# Patient Record
Sex: Female | Born: 2005 | Race: Black or African American | Hispanic: No | Marital: Single
Health system: Southern US, Community
[De-identification: ages and names within clinical notes are randomized; demographics above are authoritative.]

---
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
## 2023-03-05 IMAGING — DX DG CHEST 1V
1 series · 1 of 1 positions shown · non-contrast
Comparison: None.

CLINICAL DATA: Hemoptysis

EXAM:
CHEST  1 VIEW

[chest]
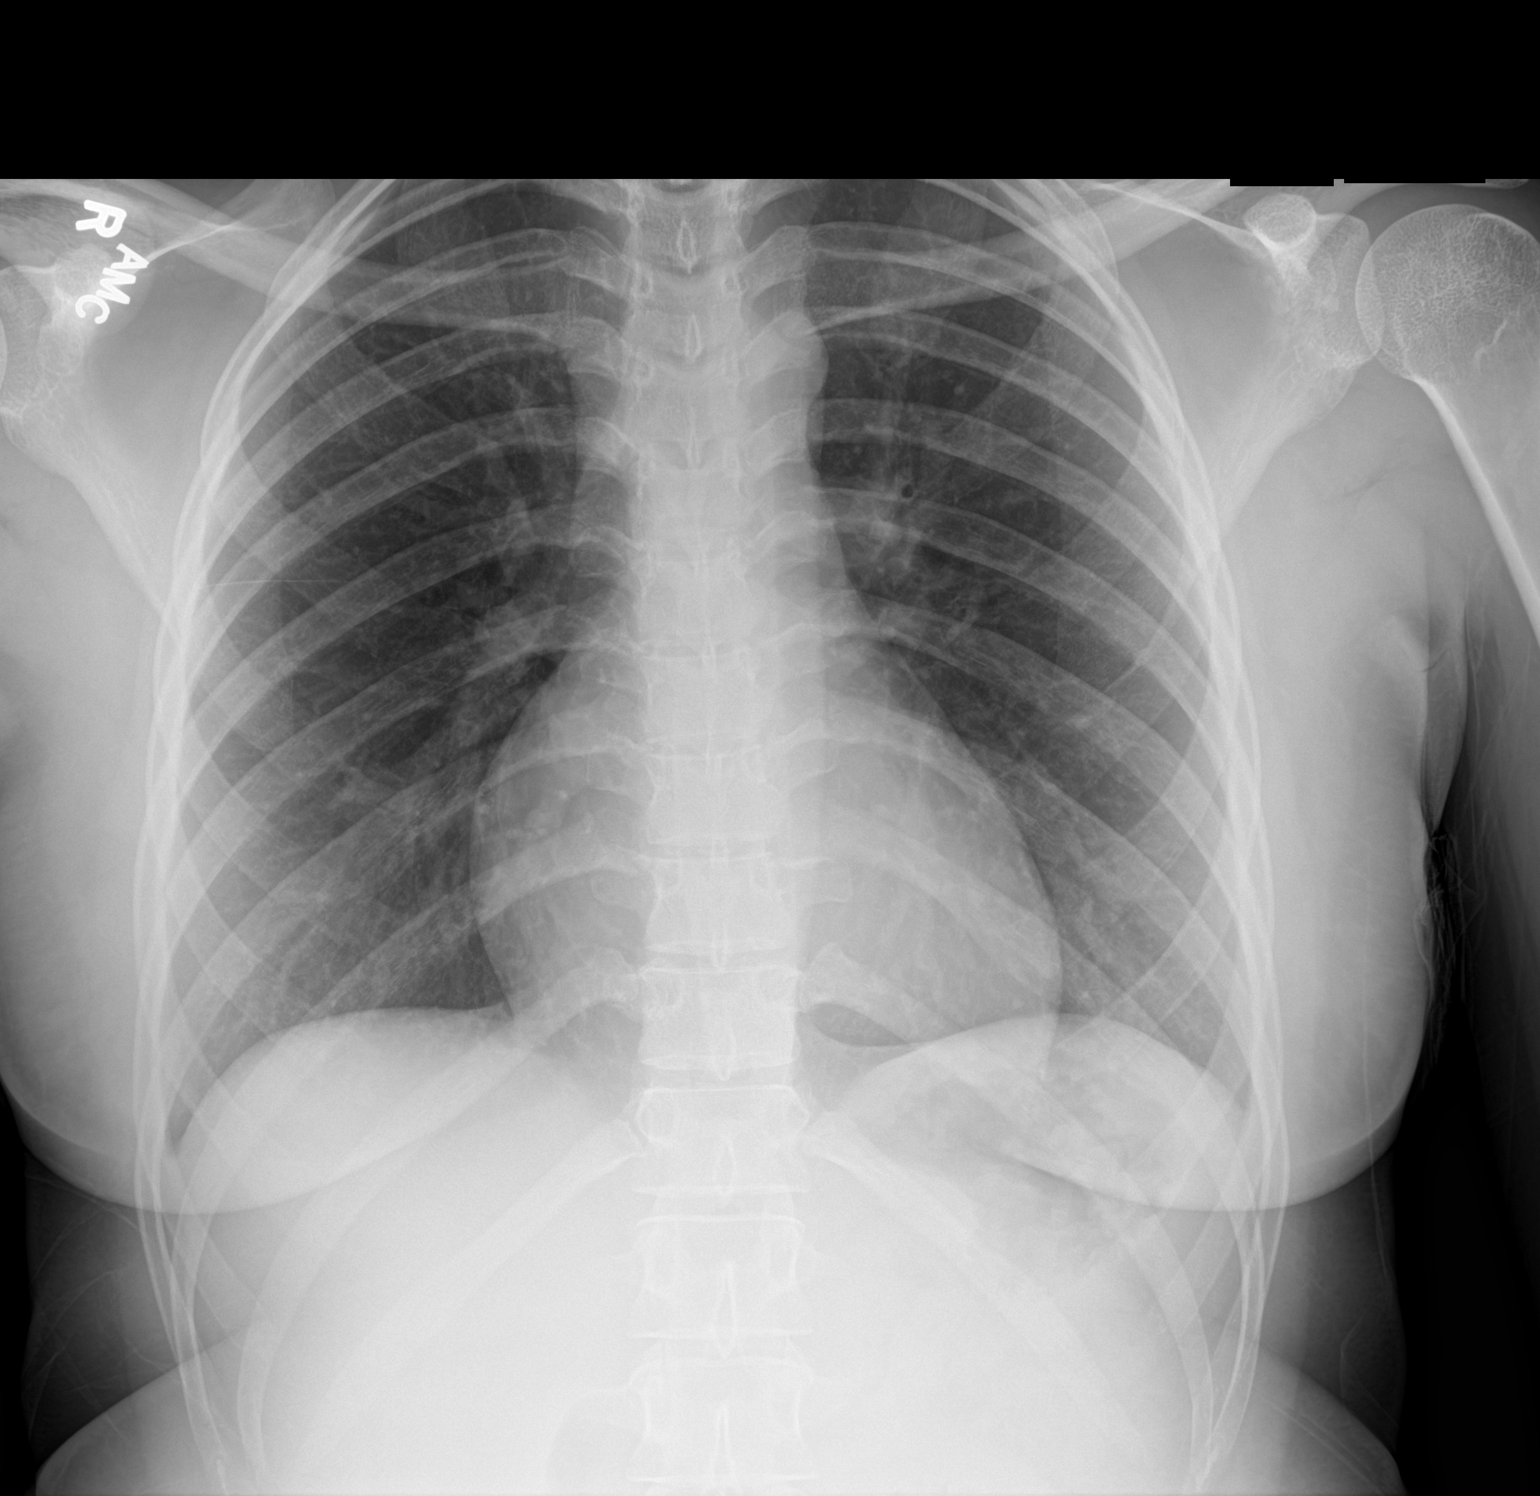

[1 of 1 positions shown; findings below may reference images not displayed]

FINDINGS: The heart size and mediastinal contours are within normal limits.
Both lungs are clear. The visualized skeletal structures are
unremarkable.
IMPRESSION: No active disease.

## 2023-09-04 DIAGNOSIS — R519 Headache, unspecified: Secondary | ICD-10-CM | POA: Insufficient documentation

## 2023-09-04 DIAGNOSIS — Z5321 Procedure and treatment not carried out due to patient leaving prior to being seen by health care provider: Secondary | ICD-10-CM | POA: Insufficient documentation

## 2023-09-04 DIAGNOSIS — Y92410 Unspecified street and highway as the place of occurrence of the external cause: Secondary | ICD-10-CM | POA: Diagnosis not present
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
## 2023-09-05 ENCOUNTER — Emergency Department (HOSPITAL_BASED_OUTPATIENT_CLINIC_OR_DEPARTMENT_OTHER)
Admission: EM | Admit: 2023-09-05 | Discharge: 2023-09-05 | Discharge status: Against Medical Advice | Payer: Medicaid Other | Attending: Emergency Medicine | Admitting: Emergency Medicine

## 2023-09-05 NOTE — ED Notes (Signed)
Reported to have left by registration

## 2024-05-14 ENCOUNTER — Other Ambulatory Visit: Payer: Self-pay | Admitting: Internal Medicine

## 2024-05-14 ENCOUNTER — Encounter: Payer: Self-pay | Admitting: Internal Medicine

## 2024-05-14 DIAGNOSIS — E010 Iodine-deficiency related diffuse (endemic) goiter: Secondary | ICD-10-CM

## 2024-05-15 ENCOUNTER — Ambulatory Visit
Admission: RE | Admit: 2024-05-15 | Discharge: 2024-05-15 | Disposition: A | Discharge status: Home / Self Care | Source: Ambulatory Visit | Attending: Internal Medicine | Admitting: Internal Medicine

## 2024-05-15 DIAGNOSIS — E010 Iodine-deficiency related diffuse (endemic) goiter: Secondary | ICD-10-CM

## 2024-11-01 ENCOUNTER — Other Ambulatory Visit: Payer: Self-pay

## 2024-11-01 ENCOUNTER — Encounter (HOSPITAL_COMMUNITY): Payer: Self-pay | Admitting: *Deleted

## 2024-11-01 ENCOUNTER — Emergency Department (HOSPITAL_COMMUNITY)
Admission: EM | Admit: 2024-11-01 | Discharge: 2024-11-01 | Disposition: A | Discharge status: Home / Self Care | Attending: Emergency Medicine | Admitting: Emergency Medicine

## 2024-11-01 ENCOUNTER — Emergency Department (HOSPITAL_COMMUNITY)

## 2024-11-01 DIAGNOSIS — J029 Acute pharyngitis, unspecified: Secondary | ICD-10-CM | POA: Insufficient documentation

## 2024-11-01 DIAGNOSIS — R079 Chest pain, unspecified: Secondary | ICD-10-CM

## 2024-11-01 DIAGNOSIS — R0602 Shortness of breath: Secondary | ICD-10-CM | POA: Insufficient documentation

## 2024-11-01 DIAGNOSIS — R0781 Pleurodynia: Secondary | ICD-10-CM | POA: Insufficient documentation

## 2024-11-01 DIAGNOSIS — R053 Chronic cough: Secondary | ICD-10-CM | POA: Insufficient documentation

## 2024-11-01 LAB — BASIC METABOLIC PANEL WITH GFR
Anion gap: 12 (ref 5–15)
BUN: 11 mg/dL (ref 6–20)
CO2: 25 mmol/L (ref 22–32)
Calcium: 9.7 mg/dL (ref 8.9–10.3)
Chloride: 101 mmol/L (ref 98–111)
Creatinine, Ser: 0.76 mg/dL (ref 0.44–1.00)
GFR, Estimated: >60 mL/min (ref >60)
Glucose, Bld: 91 mg/dL (ref 70–99)
Potassium: 4 mmol/L (ref 3.5–5.1)
Sodium: 138 mmol/L (ref 135–145)

## 2024-11-01 LAB — CBC
HCT: 40 % (ref 36.0–46.0)
Hemoglobin: 12.9 g/dL (ref 12.0–15.0)
MCH: 27 pg (ref 26.0–34.0)
MCHC: 32.3 g/dL (ref 30.0–36.0)
MCV: 83.7 fL (ref 80.0–100.0)
Platelets: 281 K/uL (ref 150–400)
RBC: 4.78 MIL/uL (ref 3.87–5.11)
RDW: 13.2 % (ref 11.5–15.5)
WBC: 4.8 K/uL (ref 4.0–10.5)
nRBC: 0 % (ref 0.0–0.2)

## 2024-11-01 LAB — RESP PANEL BY RT-PCR (RSV, FLU A&B, COVID)  RVPGX2
Influenza A by PCR: NEGATIVE
Influenza B by PCR: NEGATIVE
Resp Syncytial Virus by PCR: NEGATIVE
SARS Coronavirus 2 by RT PCR: NEGATIVE

## 2024-11-01 LAB — TROPONIN I (HIGH SENSITIVITY)
Troponin I (High Sensitivity): <2 ng/L (ref <18)
Troponin I (High Sensitivity): <2 ng/L (ref <18)

## 2024-11-01 LAB — HCG, SERUM, QUALITATIVE: Preg, Serum: NEGATIVE

## 2024-11-01 MED ORDER — IBUPROFEN 400 MG PO TABS
600.0000 mg | ORAL_TABLET | Freq: Once | ORAL | Status: AC
  Administered 2024-11-01: 600 mg via ORAL
  Filled 2024-11-01: qty 1

## 2024-11-01 NOTE — Discharge Instructions (Signed)
 Your test for heart attack (troponin) was negative but a second one was drawn and is currently in the lab.  You need to follow this up in your MyChart, if this is elevated/abnormal it is very important to come back to the hospital.  Otherwise, use ibuprofen  and/or Tylenol  to help with pain.  Follow-up with your primary care provider.  Return to the ER for any new or worsening symptoms.

## 2024-11-01 NOTE — ED Triage Notes (Signed)
 Pt states that when she woke up from her nap around 4pm she noted that she was having chest pain and sob and felt hot.  Pt went to work but continued to feel CP and sob.  Pt is speaking in full sentences, fortunately no acute distress.

## 2024-11-01 NOTE — ED Provider Notes (Signed)
 [facility] EMERGENCY DEPARTMENT AT [facility] Provider Note   CSN: [id number] Arrival date & time: 11/01/24  9563     Patient presents with: Chest Pain   Valerie Richardson is a 18 y.o. female.   HPI 18 year old female presents with chest pain.  She noticed that yesterday afternoon around 4 PM.  Sharp pain in the middle of her chest, did get worse with breathing but then that resolved.  She now has minimal but substantially improved chest pain throughout the night.  She denies any fevers or new cough.  Has developed a little mild sore throat this morning.  She has felt this way with pneumonia in the past and so she became concerned and want to get checked out.  She has some shortness of breath that has also resolved.  Denies any leg swelling, recent travel, recent surgery.  She is not on OCPs.  Prior to Admission medications  Not on File    Allergies: Patient has no known allergies.    Review of Systems  Constitutional:  Negative for fever.  Respiratory:  Positive for shortness of breath.   Cardiovascular:  Positive for chest pain. Negative for leg swelling.    Updated Vital Signs BP 108/76   Pulse 76   Temp 98 F (36.7 C) (Oral)   Resp 16   SpO2 98%   Physical Exam Vitals and nursing note reviewed.  Constitutional:      Appearance: She is well-developed.  HENT:     Head: Normocephalic and atraumatic.  Cardiovascular:     Rate and Rhythm: Normal rate and regular rhythm.     Heart sounds: Normal heart sounds.  Pulmonary:     Effort: Pulmonary effort is normal.     Breath sounds: Normal breath sounds.  Abdominal:     Palpations: Abdomen is soft.     Tenderness: There is no abdominal tenderness.  Musculoskeletal:     Right lower leg: No edema.     Left lower leg: No edema.  Skin:    General: Skin is warm and dry.  Neurological:     Mental Status: She is alert.     (all labs ordered are listed, but only abnormal results are displayed) Labs Reviewed   RESP PANEL BY RT-PCR (RSV, FLU A&B, COVID)  RVPGX2  BASIC METABOLIC PANEL WITH GFR  CBC  HCG, SERUM, QUALITATIVE  TROPONIN I (HIGH SENSITIVITY)  TROPONIN I (HIGH SENSITIVITY)    EKG: EKG Interpretation Date/Time:  Sunday November 01 2024 05:06:49 EST Ventricular Rate:  89 PR Interval:  138 QRS Duration:  94 QT Interval:  350 QTC Calculation: 425 R Axis:   91  Text Interpretation: Normal sinus rhythm Rightward axis  no acute ST/T changes Confirmed by Freddi Hamilton [phone]) on 11/01/2024 8:15:50 AM  Radiology: [facility] Chest 2 View Result Date: 11/01/2024 CLINICAL DATA:  Chest pain and shortness of breath. EXAM: CHEST - 2 VIEW COMPARISON:  December 26, 2021 FINDINGS: The heart size and mediastinal contours are within normal limits. Both lungs are clear. The visualized skeletal structures are unremarkable. IMPRESSION: No active cardiopulmonary disease. Electronically Signed   By: Suzen Dials M.D.   On: 11/01/2024 05:50     Procedures   Medications Ordered in the ED  ibuprofen  (ADVIL ) tablet 600 mg (has no administration in time range)  Medical Decision Making Amount and/or Complexity of Data Reviewed Labs: ordered.    Details: Normal troponin. Radiology: ordered and independent interpretation performed.    Details: No pneumonia ECG/medicine tests: ordered and independent interpretation performed.    Details: No ischemia   Patient presents with some nonspecific chest pain since yesterday.  While her pain was pleuritic, it is also in the setting of some viral symptoms such as sore throat and a chronic cough.  My suspicion that this is occult pneumonia, ACS, PE, etc. is all pretty low.  Despite the pleuritic symptoms she is otherwise PERC negative and I do not have a moderate or high suspicion of PE.  I do not think further workup is needed.  Her pleuritic symptoms have resolved and chest pain in general is a lot better.  Discussed  supportive care such as NSAIDs.  Her first troponin was obtained almost 12 hours after her onset of symptoms and so I think ACS is highly unlikely given the completely normal result.  However before I could see her a second troponin was sent.  She does not want to wait on the result but rather followed up in MyChart.  I discussed that while the suspicion is quite low, if this is positive this would necessitate a return.  She understands this and just wants to followed up rather than wait.  I think this is reasonable.  Will discharge with return precautions and PCP follow-up.     Final diagnoses:  Nonspecific chest pain    ED Discharge Orders     None          Freddi Hamilton, MD 11/01/24 [phone]
